# Patient Record
Sex: Female | Born: 1995 | Race: Black or African American | Hispanic: No | Marital: Single | Smoking: Never smoker
Health system: Southern US, Community
[De-identification: ages and names within clinical notes are randomized; demographics above are authoritative.]

## PROBLEM LIST (undated history)

## (undated) DIAGNOSIS — J45909 Unspecified asthma, uncomplicated: Secondary | ICD-10-CM

---

## 2017-06-01 ENCOUNTER — Emergency Department (HOSPITAL_BASED_OUTPATIENT_CLINIC_OR_DEPARTMENT_OTHER): Payer: Federal, State, Local not specified - PPO

## 2017-06-01 ENCOUNTER — Other Ambulatory Visit: Payer: Self-pay

## 2017-06-01 ENCOUNTER — Encounter (HOSPITAL_BASED_OUTPATIENT_CLINIC_OR_DEPARTMENT_OTHER): Payer: Self-pay

## 2017-06-01 ENCOUNTER — Emergency Department (HOSPITAL_BASED_OUTPATIENT_CLINIC_OR_DEPARTMENT_OTHER)
Admission: EM | Admit: 2017-06-01 | Discharge: 2017-06-01 | Disposition: A | Discharge status: Home / Self Care | Payer: Federal, State, Local not specified - PPO | Attending: Emergency Medicine | Admitting: Emergency Medicine

## 2017-06-01 DIAGNOSIS — H9202 Otalgia, left ear: Secondary | ICD-10-CM | POA: Diagnosis not present

## 2017-06-01 DIAGNOSIS — B085 Enteroviral vesicular pharyngitis: Secondary | ICD-10-CM

## 2017-06-01 DIAGNOSIS — J45909 Unspecified asthma, uncomplicated: Secondary | ICD-10-CM | POA: Diagnosis not present

## 2017-06-01 DIAGNOSIS — J029 Acute pharyngitis, unspecified: Secondary | ICD-10-CM | POA: Diagnosis present

## 2017-06-01 DIAGNOSIS — R05 Cough: Secondary | ICD-10-CM

## 2017-06-01 DIAGNOSIS — R059 Cough, unspecified: Secondary | ICD-10-CM

## 2017-06-01 DIAGNOSIS — H9203 Otalgia, bilateral: Secondary | ICD-10-CM

## 2017-06-01 HISTORY — DX: Unspecified asthma, uncomplicated: J45.909

## 2017-06-01 LAB — PREGNANCY, URINE: PREG TEST UR: NEGATIVE

## 2017-06-01 LAB — RAPID STREP SCREEN (MED CTR MEBANE ONLY): STREPTOCOCCUS, GROUP A SCREEN (DIRECT): NEGATIVE

## 2017-06-01 MED ORDER — LIDOCAINE VISCOUS 2 % MT SOLN: 10.0000 mL | OROMUCOSAL | 0 refills | Status: AC | PRN

## 2017-06-01 MED ORDER — FLUTICASONE PROPIONATE 50 MCG/ACT NA SUSP: 1.0000 | Freq: Every day | NASAL | 2 refills | Status: AC

## 2017-06-01 MED ORDER — IBUPROFEN 400 MG PO TABS
600.0000 mg | ORAL_TABLET | Freq: Once | ORAL | Status: AC
  Administered 2017-06-01: 600 mg via ORAL
  Filled 2017-06-01: qty 1

## 2017-06-01 MED ORDER — IBUPROFEN 600 MG PO TABS: 600.0000 mg | ORAL_TABLET | Freq: Four times a day (QID) | ORAL | 0 refills | Status: AC | PRN

## 2017-06-01 MED ORDER — ALBUTEROL SULFATE HFA 108 (90 BASE) MCG/ACT IN AERS
1.0000 | INHALATION_SPRAY | Freq: Once | RESPIRATORY_TRACT | Status: AC
  Administered 2017-06-01: 1 via RESPIRATORY_TRACT
  Filled 2017-06-01: qty 6.7

## 2017-06-01 NOTE — Discharge Instructions (Signed)
Please read and follow all provided instructions.  Your diagnoses today include:  1. Sore throat   2. Herpangina   3. Otalgia of both ears   4. Cough     Tests performed today include: Strep test: was negative for strep throat Strep culture: you will be notified if this comes back positive Vital signs. See below for your results today.   Medications prescribed:   Please take the Flonase, 1 puff in each nostril each morning.  The left side of your throat has some ulcerations.  There are some viruses that can cause this, we called herpangina.  This will self resolve.  Please use the lidocaine swish and spit as needed for comfort.  He will gargle this.  Swish and spit do not swallow.  You are prescribed ibuprofen, a non-steroidal anti-inflammatory agent (NSAID) for pain. You may take 600mg  every 6 hours as needed for pain. If still requiring this medication around the clock for acute pain after 10 days, please see your primary healthcare provider.  Women who are pregnant, breastfeeding, or planning on becoming pregnant should not take non-steroidal anti-inflammatories such as   You may combine this medication with Tylenol, 650 mg every 6 hours, so you are receiving something for pain every 3 hours.  This is not a long-term medication unless under the care and direction of your primary provider. Taking this medication long-term and not under the supervision of a healthcare provider could increase the risk of stomach ulcers, kidney problems, and cardiovascular problems such as high blood pressure.   Home care instructions:  Please read the educational materials provided and follow any instructions contained in this packet.  Follow-up instructions: Please follow-up with your primary care provider as needed for further evaluation of your symptoms.  Return instructions:  Please return to the Emergency Department if you experience worsening symptoms.  Return if you have worsening problems  swallowing, your neck becomes swollen, you cannot swallow your saliva or your voice becomes muffled.  Return with high persistent fever, persistent vomiting, or if you have trouble breathing.  Please return if you have any other emergent concerns.  Additional Information:  Your vital signs today were: BP (!) 146/86 (BP Location: Right Arm)    Pulse 80    Temp 99.4 F (37.4 C) (Oral)    Resp 18    Ht 5\' 2"  (1.575 m)    Wt 63.5 kg (140 lb)    LMP 05/11/2017    SpO2 100%    BMI 25.61 kg/m  If your blood pressure (BP) was elevated above 135/85 this visit, please have this repeated by your doctor within one month. --------------

## 2017-06-01 NOTE — ED Notes (Signed)
ED Provider at bedside. 

## 2017-06-01 NOTE — ED Provider Notes (Signed)
[facility] EMERGENCY DEPARTMENT Provider Note   CSN: [id number] Arrival date & time: 06/01/17  2029     History   Chief Complaint Chief Complaint  Patient presents with  . Sore Throat    HPI Caitlin Stewart is a 22 y.o. female.  HPI  Patient is a 3 old female with a past medical history of asthma presenting for sore throat, cough, and otalgia.  Patient reports that she began having a sore throat approximately 1 week ago, and noted that she had a fever up to 102 at that time.  Patient also noted that she was throwing up, nonbilious and nonbloody emesis.  Patient reports that she has been having difficulty swallowing due to pain in her throat.  Patient denies oral sex. Patient reports she feels increasing pain on the left side of the throat compared to the right and feels it is more swollen.  Patient denies chest pain, change in phonation, difficulty breathing.  Patient reports she has had a nonproductive cough for the past 2-3 days.  No recurrence and fevers.  Patient denies any neck swelling, submandibular tenderness, or lingual swelling.  Patient reports she has taken Mucinex without full relief of her sore throat.  Patient reports she no longer has her inhaler.   Past Medical History:  Diagnosis Date  . Asthma     There are no active problems to display for this patient.   History reviewed. No pertinent surgical history.   OB History   None      Home Medications    Prior to Admission medications   Medication Sig Start Date End Date Taking? Authorizing Provider  ALBUTEROL IN Inhale into the lungs.   Yes [provider]  fluticasone (FLONASE) 50 MCG/ACT nasal spray Place 1 spray into both nostrils daily. 06/01/17   Aviva Kluver B, PA-C  ibuprofen (ADVIL,MOTRIN) 600 MG tablet Take 1 tablet (600 mg total) by mouth every 6 (six) hours as needed. 06/01/17   Aviva Kluver B, PA-C  lidocaine (XYLOCAINE) 2 % solution Use as directed 10 mLs in the mouth or  throat as needed for mouth pain. Swish and spit.  Do not swallow. 06/01/17   Elisha Ponder, PA-C    Family History History reviewed. No pertinent family history.  Social History Social History   Tobacco Use  . Smoking status: Never Smoker  . Smokeless tobacco: Never Used  Substance Use Topics  . Alcohol use: Never    Frequency: Never  . Drug use: Never     Allergies   Patient has no known allergies.   Review of Systems Review of Systems  Constitutional: Negative for chills and fever.  HENT: Positive for sore throat and trouble swallowing. Negative for congestion, mouth sores and voice change.   Respiratory: Positive for choking. Negative for shortness of breath and stridor.   Gastrointestinal: Negative for nausea and vomiting.     Physical Exam Updated Vital Signs BP (!) 146/86 (BP Location: Right Arm)   Pulse 80   Temp 99.4 F (37.4 C) (Oral)   Resp 18   Ht 5\' 2"  (1.575 m)   Wt 63.5 kg (140 lb)   LMP 05/11/2017   SpO2 100%   BMI 25.61 kg/m   Physical Exam  Constitutional: She appears well-developed and well-nourished. No distress.  Sitting comfortably in bed.  HENT:  Head: Normocephalic and atraumatic.  Mouth/Throat: Tonsils are 1+ on the right. Tonsils are 1+ on the left.  Normal phonation. No muffled voice  sounds. Patient swallows secretions without difficulty. Dentition normal. No lesions of tongue or buccal mucosa. Uvula midline. No asymmetric swelling of the posterior pharynx. +Erythema of posterior pharynx.  There are punctate erythematous ulcerations of the anterior tonsillar pillar on the left. No tonsillar exuduate. No lingual swelling. No induration inferior to tongue. No submandibular tenderness, swelling, or induration.  Tissues of the neck supple. No cervical lymphadenopathy. Right TM without erythema or effusion; left TM without erythema or effusion.  Bilateral external auditory canals are without erythema, edema, or abnormality.   Eyes:  Conjunctivae are normal. Right eye exhibits no discharge. Left eye exhibits no discharge.  EOMs normal to gross examination.  Neck: Normal range of motion.  Cardiovascular: Normal rate, regular rhythm and normal heart sounds.  Pulmonary/Chest: Effort normal and breath sounds normal. She has no wheezes. She has no rales.  Normal respiratory effort. Patient converses comfortably. No audible wheeze or stridor.  Abdominal: She exhibits no distension.  Musculoskeletal: Normal range of motion.  Neurological: She is alert.  Cranial nerves intact to gross observation. Patient moves extremities without difficulty.  Skin: Skin is warm and dry. She is not diaphoretic.  Psychiatric: She has a normal mood and affect. Her behavior is normal. Judgment and thought content normal.  Nursing note and vitals reviewed.    ED Treatments / Results  Labs (all labs ordered are listed, but only abnormal results are displayed) Labs Reviewed  RAPID STREP SCREEN ([facility] ONLY)  CULTURE, GROUP A STREP [facility])  HSV CULTURE AND TYPING  PREGNANCY, URINE    EKG None  Radiology Dg Chest 2 View  Result Date: 06/01/2017 CLINICAL DATA:  Productive cough, sore throat EXAM: CHEST - 2 VIEW COMPARISON:  None available FINDINGS: The heart size and mediastinal contours are within normal limits. Both lungs are clear. The visualized skeletal structures are unremarkable. IMPRESSION: No active cardiopulmonary disease. Electronically Signed   By: Judie PetitM.  Shick M.D.   On: 06/01/2017 21:16    Procedures Procedures (including critical care time)  Medications Ordered in ED Medications  albuterol (PROVENTIL HFA;VENTOLIN HFA) 108 (90 Base) MCG/ACT inhaler 1 puff (1 puff Inhalation Given 06/01/17 2338)  ibuprofen (ADVIL,MOTRIN) tablet 600 mg (600 mg Oral Given 06/01/17 2350)     Initial Impression / Assessment and Plan / ED Course  I have reviewed the triage vital signs and the nursing notes.  Pertinent labs & imaging results  that were available during my care of the patient were reviewed by me and considered in my medical decision making (see chart for details).     Caitlin Stewart is a 22 y.o. female who presents to ED for sore throat. Patient nontoxic appearing and in no acute distress. Rapid strep negative. Suspect viral etiology.  Patient does exhibit punctate ulcerations of the left side of the throat, where she is having pain, which were cultured for herpes.  Given that there are no vesicles, have low suspicion for acute herpes infection.  Suspect herpangina secondary to alternate virus.  Presentation not concerning for PTA or infection spread to soft tissue. No trismus or uvula deviation. Patient with normal phonation. Exam demonstrates soft neck tissue, no swelling or induration inferior to the tongue or in the submandibular space  Treated in the ED with NSAIDs, pain medication. Rx for viscous lidocaine.  Additionally, pulmonary exam without abnormality and chest x-ray without any evidence of cardiopulmonary disease, reviewed by me.  Patient exhibits no evidence of effusion, erythema, or TM abnormality to correlate with her otalgia.  Suspect eustachian tube dysfunction.  Rx for Flonase.  Patient able to drink water in ED without difficulty with intact air way. Specific return precautions discussed for change in voice, inability to tolerate secretions, difficulty breathing or swallowing, or increased nausea or vomiting. Discussed importance of hydration. Recommended PCP follow up. All questions answered.  Final Clinical Impressions(s) / ED Diagnoses   Final diagnoses:  Sore throat  Herpangina  Otalgia of both ears  Cough    ED Discharge Orders        Ordered    lidocaine (XYLOCAINE) 2 % solution  As needed     06/01/17 2340    fluticasone (FLONASE) 50 MCG/ACT nasal spray  Daily     06/01/17 2340    ibuprofen (ADVIL,MOTRIN) 600 MG tablet  Every 6 hours PRN     06/01/17 2340       Elisha Ponder,  PA-C 06/02/17 0005    Alvira Monday, MD 06/04/17 2159

## 2017-06-01 NOTE — ED Triage Notes (Signed)
Pt presents to the ED with a CC of sore throat, painful swallowing, sensation of swelling to left side of posterior pharynx, bilateral ear pain, decreased hearing in left ear, cough (productive), decreased appetite, one episode of emesis last week, diarrhea yesterday morning, and cold sweats 2 days ago. Denies rash, or nausea. No meds within the last 8 hours.

## 2017-06-04 LAB — CULTURE, GROUP A STREP (THRC)

## 2017-06-05 LAB — HSV CULTURE AND TYPING

## 2019-02-05 IMAGING — DX DG CHEST 2V
2 series · 2 of 2 positions shown · non-contrast
Comparison: None available

CLINICAL DATA: Productive cough, sore throat

EXAM:
CHEST - 2 VIEW

[chest pa]
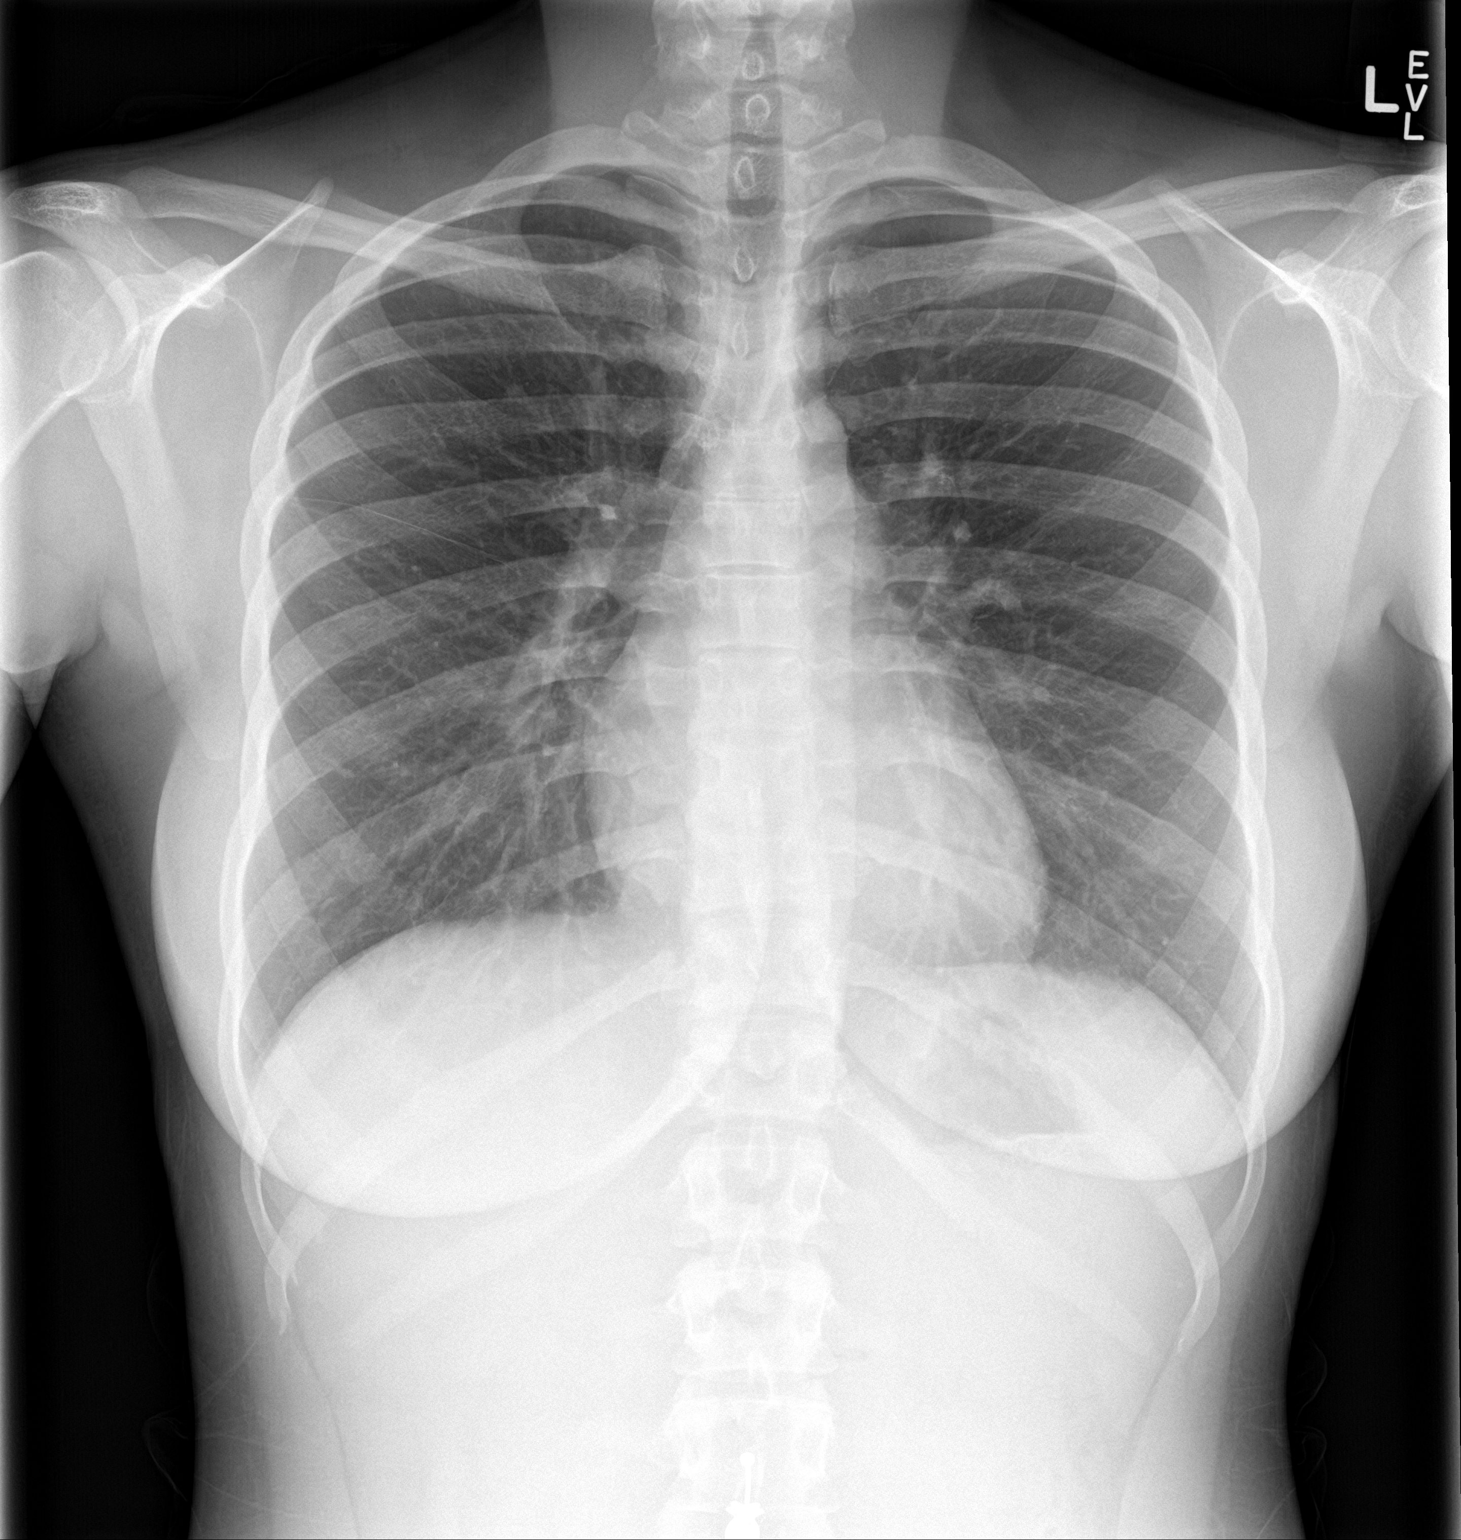

[chest lat]
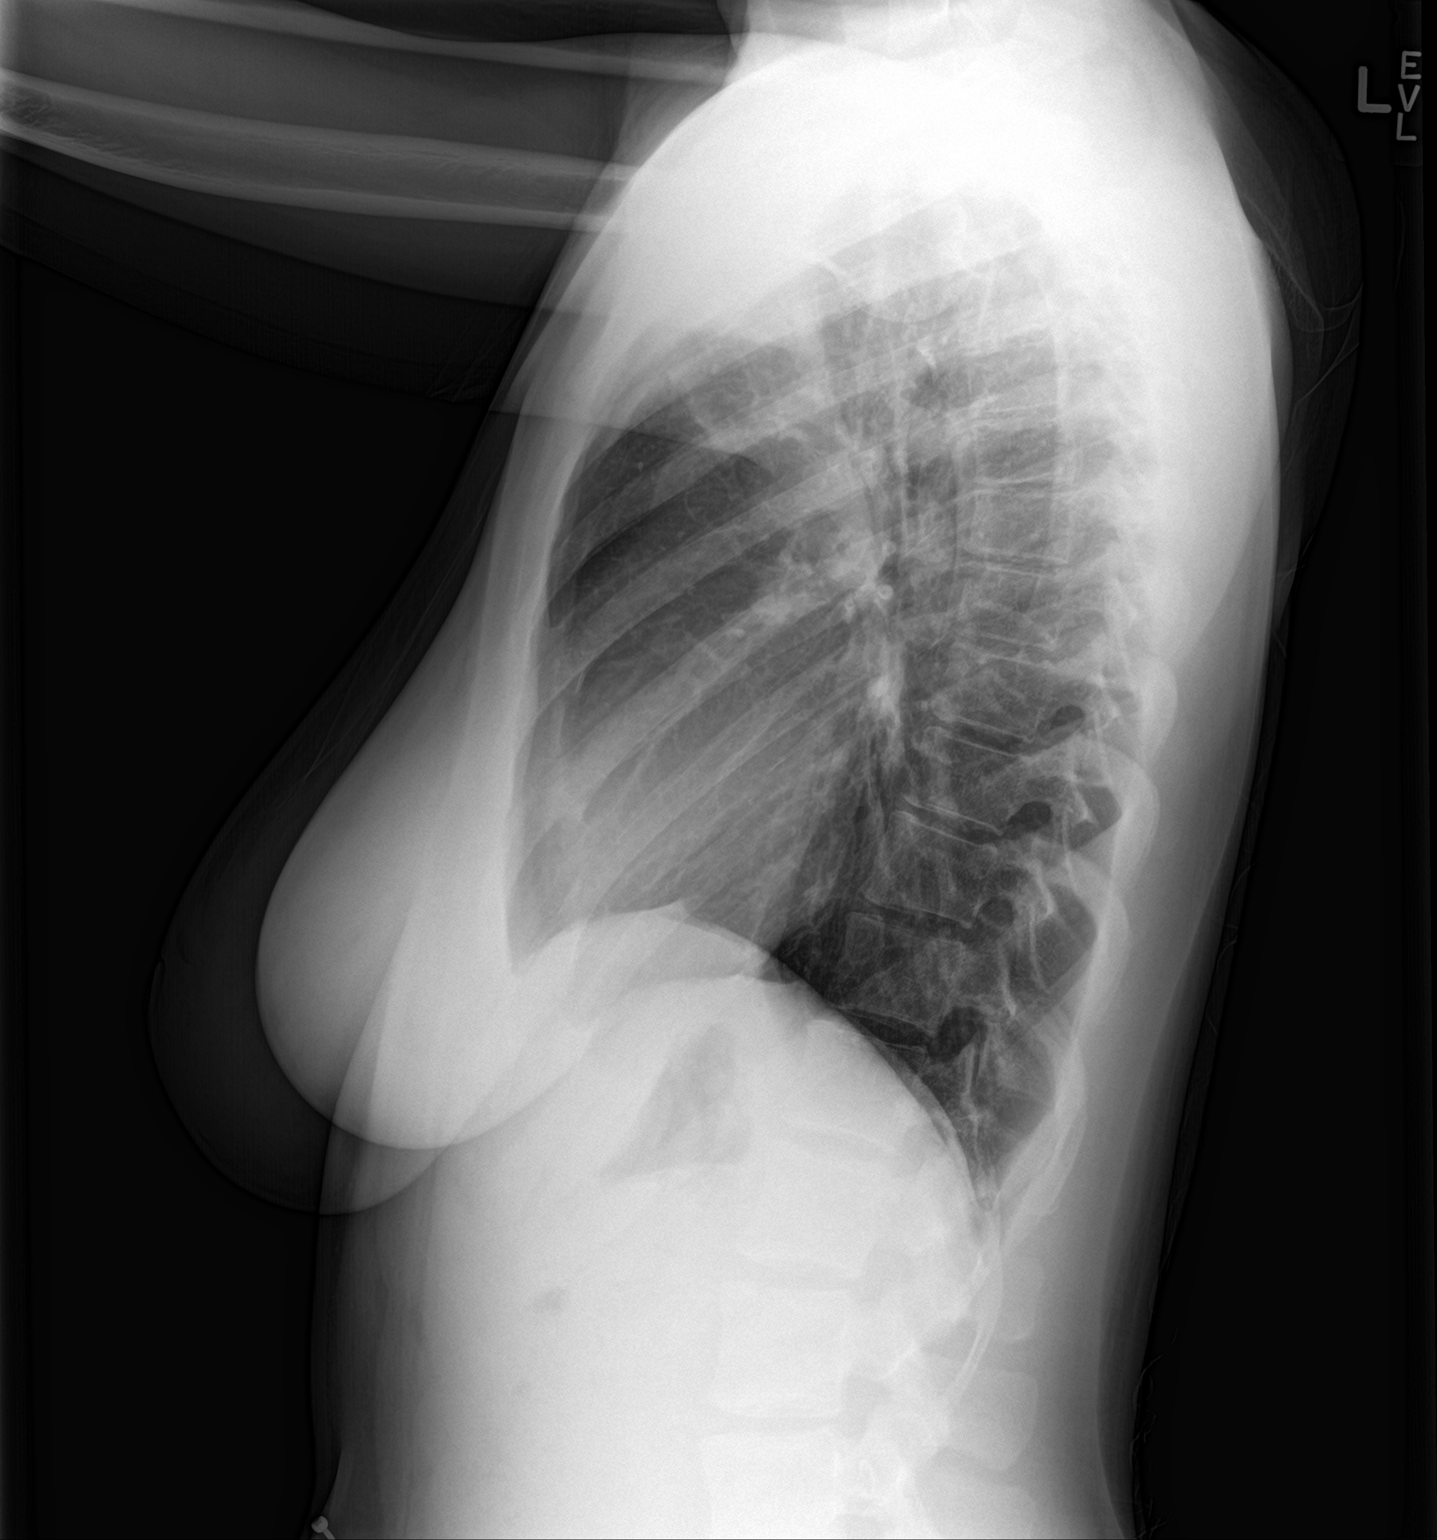

[2 of 2 positions shown; findings below may reference images not displayed]

FINDINGS: The heart size and mediastinal contours are within normal limits.
Both lungs are clear. The visualized skeletal structures are
unremarkable.
IMPRESSION: No active cardiopulmonary disease.
